# Patient Record
Sex: Male | Born: 1980 | Race: White | Hispanic: No | Marital: Married | Smoking: Never smoker
Health system: Southern US, Community
[De-identification: ages and names within clinical notes are randomized; demographics above are authoritative.]

## PROBLEM LIST (undated history)

## (undated) DIAGNOSIS — M71161 Other infective bursitis, right knee: Secondary | ICD-10-CM

## (undated) DIAGNOSIS — Z8719 Personal history of other diseases of the digestive system: Secondary | ICD-10-CM

## (undated) HISTORY — PX: ESOPHAGEAL DILATION: SHX303

---

## 2018-08-01 ENCOUNTER — Emergency Department (HOSPITAL_COMMUNITY): Payer: BC Managed Care – PPO

## 2018-08-01 ENCOUNTER — Emergency Department (HOSPITAL_COMMUNITY)
Admission: EM | Admit: 2018-08-01 | Discharge: 2018-08-01 | Disposition: A | Discharge status: Home / Self Care | Payer: BC Managed Care – PPO | Attending: Emergency Medicine | Admitting: Emergency Medicine

## 2018-08-01 ENCOUNTER — Other Ambulatory Visit: Payer: Self-pay

## 2018-08-01 ENCOUNTER — Encounter (HOSPITAL_COMMUNITY): Payer: Self-pay | Admitting: Emergency Medicine

## 2018-08-01 DIAGNOSIS — L03115 Cellulitis of right lower limb: Secondary | ICD-10-CM | POA: Diagnosis not present

## 2018-08-01 DIAGNOSIS — L0390 Cellulitis, unspecified: Secondary | ICD-10-CM

## 2018-08-01 DIAGNOSIS — M25561 Pain in right knee: Secondary | ICD-10-CM | POA: Diagnosis present

## 2018-08-01 LAB — CBC WITH DIFFERENTIAL/PLATELET
Abs Immature Granulocytes: 0.04 10*3/uL (ref 0.00–0.07)
Basophils Absolute: 0 10*3/uL (ref 0.0–0.1)
Basophils Relative: 0 %
Eosinophils Absolute: 0.1 10*3/uL (ref 0.0–0.5)
Eosinophils Relative: 1 %
HCT: 46 % (ref 39.0–52.0)
Hemoglobin: 15.6 g/dL (ref 13.0–17.0)
Immature Granulocytes: 0 %
Lymphocytes Relative: 15 %
Lymphs Abs: 2.3 10*3/uL (ref 0.7–4.0)
MCH: 29.2 pg (ref 26.0–34.0)
MCHC: 33.9 g/dL (ref 30.0–36.0)
MCV: 86.1 fL (ref 80.0–100.0)
Monocytes Absolute: 1 10*3/uL (ref 0.1–1.0)
Monocytes Relative: 7 %
Neutro Abs: 11.9 10*3/uL — ABNORMAL HIGH (ref 1.7–7.7)
Neutrophils Relative %: 77 %
Platelets: 257 10*3/uL (ref 150–400)
RBC: 5.34 MIL/uL (ref 4.22–5.81)
RDW: 13.3 % (ref 11.5–15.5)
WBC: 15.4 10*3/uL — ABNORMAL HIGH (ref 4.0–10.5)
nRBC: 0 % (ref 0.0–0.2)

## 2018-08-01 LAB — COMPREHENSIVE METABOLIC PANEL
ALT: 35 U/L (ref 0–44)
AST: 20 U/L (ref 15–41)
Albumin: 4.8 g/dL (ref 3.5–5.0)
Alkaline Phosphatase: 73 U/L (ref 38–126)
Anion gap: 11 (ref 5–15)
BUN: 21 mg/dL — ABNORMAL HIGH (ref 6–20)
CO2: 27 mmol/L (ref 22–32)
Calcium: 9.3 mg/dL (ref 8.9–10.3)
Chloride: 99 mmol/L (ref 98–111)
Creatinine, Ser: 0.97 mg/dL (ref 0.61–1.24)
GFR calc Af Amer: >60 mL/min (ref >60)
GFR calc non Af Amer: >60 mL/min (ref >60)
Glucose, Bld: 97 mg/dL (ref 70–99)
Potassium: 4.1 mmol/L (ref 3.5–5.1)
Sodium: 137 mmol/L (ref 135–145)
Total Bilirubin: 0.8 mg/dL (ref 0.3–1.2)
Total Protein: 8.3 g/dL — ABNORMAL HIGH (ref 6.5–8.1)

## 2018-08-01 LAB — LACTIC ACID, PLASMA
Lactic Acid, Venous: 1.8 mmol/L (ref 0.5–1.9)
Lactic Acid, Venous: 1.7 mmol/L (ref 0.5–1.9)

## 2018-08-01 MED ORDER — SODIUM CHLORIDE 0.9 % IV SOLN
1.0000 g | Freq: Once | INTRAVENOUS | Status: AC
  Administered 2018-08-01: 15:00:00 1 g via INTRAVENOUS
  Filled 2018-08-01: qty 10

## 2018-08-01 MED ORDER — FENTANYL CITRATE (PF) 100 MCG/2ML IJ SOLN
50.0000 ug | INTRAMUSCULAR | Status: AC | PRN
  Administered 2018-08-01 (×2): 50 ug via INTRAVENOUS
  Filled 2018-08-01 (×2): qty 2

## 2018-08-01 MED ORDER — CEPHALEXIN 500 MG PO CAPS: 500.0000 mg | ORAL_CAPSULE | Freq: Two times a day (BID) | ORAL | 0 refills | Status: AC

## 2018-08-01 NOTE — ED Triage Notes (Signed)
Pt sent by UC for infection in his right knee. They sent him to be checked for infection in the joint.

## 2018-08-01 NOTE — ED Provider Notes (Signed)
[facility] EMERGENCY DEPARTMENT Provider Note   CSN: [id number] Arrival date & time: 08/01/18  1125    History   Chief Complaint Chief Complaint  Patient presents with   Cellulitis    HPI Brandon HeapDavid Graham is a [age] y.o. male.     HPI   Patient is a 38 year old male who presents to the emergency department today for evaluation of right knee pain.  States that a few days ago he thought that he had an ingrown hair on his knees so he plucked the hair out.  The next morning he woke up and he had redness and pain to the patella.  The redness and pain is gradually gotten worse.  The pain is constant.  He rates at 9/10.  He also now has pain that tracks up the thigh and down the calf.  He has had no fevers.  No nausea vomiting or other systemic symptoms at home.  He denies a history of IV drug use, HIV, diabetes, or other history of immunocompromise  He was seen at urgent care prior to arrival and was sent here for further evaluation.  History reviewed. No pertinent past medical history.  There are no active problems to display for this patient.   History reviewed. No pertinent surgical history.    Home Medications    Prior to Admission medications   Medication Sig Start Date End Date Taking? Authorizing Provider  cephALEXin (KEFLEX) 500 MG capsule Take 1 capsule (500 mg total) by mouth 2 (two) times daily for 7 days. 08/01/18 08/08/18  Bettey Muraoka S, PA-C    Family History No family history on file.  Social History Social History   Tobacco Use   Smoking status: Never Smoker   Smokeless tobacco: Never Used  Substance Use Topics   Alcohol use: Never    Frequency: Never   Drug use: Never     Allergies   Patient has no allergy information on record.   Review of Systems Review of Systems  Constitutional: Negative for chills and fever.  HENT: Negative for congestion.   Eyes: Negative for visual disturbance.  Respiratory: Negative for shortness of breath.     Cardiovascular: Negative for chest pain.  Gastrointestinal: Negative for nausea and vomiting.  Genitourinary: Negative for flank pain.  Musculoskeletal:       Right knee pain  Skin: Positive for color change.  Neurological: Negative for weakness and numbness.     Physical Exam Updated Vital Signs BP 119/69    Pulse 91    Temp 98.8 F (37.1 C) (Oral)    Resp 18    Ht 5\' 8"  (1.727 m)    Wt 99.8 kg    SpO2 100%    BMI 33.45 kg/m   Physical Exam Vitals signs and nursing note reviewed.  Constitutional:      Appearance: He is well-developed.  HENT:     Head: Normocephalic and atraumatic.  Eyes:     Conjunctiva/sclera: Conjunctivae normal.  Neck:     Musculoskeletal: Neck supple.  Cardiovascular:     Rate and Rhythm: Regular rhythm. Tachycardia present.     Pulses: Normal pulses.  Pulmonary:     Effort: Pulmonary effort is normal.     Breath sounds: Normal breath sounds.  Abdominal:     Palpations: Abdomen is soft.     Tenderness: There is no abdominal tenderness.  Musculoskeletal:     Comments: Erythema noted to the patella with central punctate area that is open. No drainage  of pus. There is induration but no fluctuance. No joint effusion noted. No TTP to the non-erythematous areas of the knee (suprapatellar, infrapatellar, medial/lateral joint lines). Able to fully flex/extend the knee without significant discomfort. Able to ambulate. No edema to the RLE, there is some calf TTP.   Skin:    General: Skin is warm and dry.  Neurological:     Mental Status: He is alert.      ED Treatments / Results  Labs (all labs ordered are listed, but only abnormal results are displayed) Labs Reviewed  COMPREHENSIVE METABOLIC PANEL - Abnormal; Notable for the following components:      Result Value   BUN 21 (*)    Total Protein 8.3 (*)    All other components within normal limits  CBC WITH DIFFERENTIAL/PLATELET - Abnormal; Notable for the following components:   WBC 15.4 (*)     Neutro Abs 11.9 (*)    All other components within normal limits  LACTIC ACID, PLASMA  LACTIC ACID, PLASMA    EKG None  Radiology [place] Venous Img Lower Right (dvt Study)  Result Date: 08/01/2018 CLINICAL DATA:  38 year old male with right knee redness and pain EXAM: RIGHT LOWER EXTREMITY VENOUS DOPPLER ULTRASOUND TECHNIQUE: Gray-scale sonography with graded compression, as well as color Doppler and duplex ultrasound were performed to evaluate the lower extremity deep venous systems from the level of the common femoral vein and including the common femoral, femoral, profunda femoral, popliteal and calf veins including the posterior tibial, peroneal and gastrocnemius veins when visible. The superficial great saphenous vein was also interrogated. Spectral Doppler was utilized to evaluate flow at rest and with distal augmentation maneuvers in the common femoral, femoral and popliteal veins. COMPARISON:  None. FINDINGS: Contralateral Common Femoral Vein: Respiratory phasicity is normal and symmetric with the symptomatic side. No evidence of thrombus. Normal compressibility. Common Femoral Vein: No evidence of thrombus. Normal compressibility, respiratory phasicity and response to augmentation. Saphenofemoral Junction: No evidence of thrombus. Normal compressibility and flow on color Doppler imaging. Profunda Femoral Vein: No evidence of thrombus. Normal compressibility and flow on color Doppler imaging. Femoral Vein: No evidence of thrombus. Normal compressibility, respiratory phasicity and response to augmentation. Popliteal Vein: No evidence of thrombus. Normal compressibility, respiratory phasicity and response to augmentation. Calf Veins: No evidence of thrombus. Normal compressibility and flow on color Doppler imaging. Superficial Great Saphenous Vein: No evidence of thrombus. Normal compressibility. Venous Reflux:  None. Other Findings: Mildly prominent superficial inguinal lymph node is almost certainly  reactive in nature. IMPRESSION: 1. No evidence of deep venous thrombosis. 2. A mildly prominent superficial inguinal lymph node is likely reactive in nature. Electronically Signed   By: Malachy MoanHeath  McCullough M.D.   On: 08/01/2018 14:38   Dg Knee Complete 4 Views Right  Result Date: 08/01/2018 CLINICAL DATA:  Anterior knee pain, swelling, and redness since Friday. EXAM: RIGHT KNEE - COMPLETE 4+ VIEW COMPARISON:  None. FINDINGS: No acute fracture or dislocation. No joint effusion. Minimal medial compartment joint space narrowing with small marginal osteophytes. Bone mineralization is normal. Prepatellar soft tissue swelling. IMPRESSION: 1. Prepatellar soft tissue swelling.  No acute osseous abnormality. 2. Mild medial compartment degenerative changes. Electronically Signed   By: Obie DredgeWilliam T Derry M.D.   On: 08/01/2018 12:01    Procedures Procedures (including critical care time)  Medications Ordered in ED Medications  fentaNYL (SUBLIMAZE) injection 50 mcg (50 mcg Intravenous Given 08/01/18 1519)  cefTRIAXone (ROCEPHIN) 1 g in sodium chloride 0.9 % 100 mL IVPB (  0 g Intravenous Stopped 08/01/18 1515)     Initial Impression / Assessment and Plan / ED Course  I have reviewed the triage vital signs and the nursing notes.  Pertinent labs & imaging results that were available during my care of the patient were reviewed by me and considered in my medical decision making (see chart for details).    Final Clinical Impressions(s) / ED Diagnoses   Final diagnoses:  Cellulitis, unspecified cellulitis site   Pt here with c/o redness and pain to the right knee for several days after he pulled out an ingrown hair over the patella. No h/o IVDU. No h/o diabetes, hiv or immunocompromise. No systemic sxs or fevers.  On arrival he is afebrile. He is marginally tachycardic and hypertensive which I suspect are 2/2 pain as they both improved following administration of pain medication.  Erythema noted to the patella  with central punctate area that is open. No drainage of pus. There is induration but no fluctuance. No joint effusion noted. No TTP to the non-erythematous areas of the knee (suprapatellar, infrapatellar, medial/lateral joint lines). Able to fully flex/extend the knee without significant discomfort. Able to ambulate. No edema to the RLE, there is some calf TTP.    Labs drawn in triage reviewed. CBC notable for leukocytosis.  CMP is nonacute Lactic acid is negative  Xray with Prepatellar soft tissue swelling.  No acute osseous abnormality. 2. Mild medial compartment degenerative changes. Notably, there is no joint effusion.   Clinically, pt appears to have cellulitis over the patella. He is nontoxic and nonseptic appearing. He does not meet admission criteria. Have very low suspicion for a septic joint given he is able to fully range his knee without difficulty and has no evidence of joint effusion on xray. Dose of abx given in the ED.   2:26 PM Discussed case with orthopedics Dr. Edmonia Lynch with orthopedics who recommends placing the pt on keflex and having him f/u in the office on Wed or Fri (7/15 or 7/17).   Pt seen in conjunction with Dr. Thurnell Garbe who personally evaluated the pt and is in agreement with this diagnosis. She did recommend LE [place] due to pt c/o calf pain which was negative for DVT.   Discussed plan with pt. Discussed specific return precautions. he voices understanding and is in agreement.  All questions answered, patient stable for discharge.  ED Discharge Orders         Ordered    cephALEXin (KEFLEX) 500 MG capsule  2 times daily     08/01/18 1537           Nickie Warwick S, PA-C 08/01/18 [place], [place], DO 08/05/18 1535

## 2018-08-01 NOTE — Discharge Instructions (Signed)
You were given a prescription for antibiotics. Please take the antibiotic prescription fully.   You were given a referral to orthopedics, please call the office today to schedule an appointment for later this week.  Dr. Percell Miller stated that he could likely see him in the office on either Wednesday or Friday of this week.  Please return to the emergency room immediately if you experience any new or worsening symptoms or any symptoms that indicate worsening infection such as fevers, increased redness/swelling/pain, warmth, or drainage from the affected area.

## 2018-08-03 ENCOUNTER — Other Ambulatory Visit: Payer: Self-pay

## 2018-08-03 ENCOUNTER — Encounter (HOSPITAL_BASED_OUTPATIENT_CLINIC_OR_DEPARTMENT_OTHER): Payer: Self-pay | Admitting: *Deleted

## 2018-08-04 ENCOUNTER — Other Ambulatory Visit (HOSPITAL_COMMUNITY)
Admission: RE | Admit: 2018-08-04 | Discharge: 2018-08-04 | Disposition: A | Discharge status: Home / Self Care | Payer: BC Managed Care – PPO | Source: Ambulatory Visit | Attending: Orthopedic Surgery | Admitting: Orthopedic Surgery

## 2018-08-04 DIAGNOSIS — Z1159 Encounter for screening for other viral diseases: Secondary | ICD-10-CM | POA: Insufficient documentation

## 2018-08-04 LAB — SARS CORONAVIRUS 2 (TAT 6-24 HRS): SARS Coronavirus 2: NEGATIVE

## 2018-08-05 ENCOUNTER — Ambulatory Visit (HOSPITAL_BASED_OUTPATIENT_CLINIC_OR_DEPARTMENT_OTHER): Payer: BC Managed Care – PPO | Admitting: Anesthesiology

## 2018-08-05 ENCOUNTER — Ambulatory Visit (HOSPITAL_BASED_OUTPATIENT_CLINIC_OR_DEPARTMENT_OTHER)
Admission: RE | Admit: 2018-08-05 | Discharge: 2018-08-05 | Disposition: A | Discharge status: Home / Self Care | Payer: BC Managed Care – PPO | Attending: Orthopedic Surgery | Admitting: Orthopedic Surgery

## 2018-08-05 ENCOUNTER — Encounter (HOSPITAL_BASED_OUTPATIENT_CLINIC_OR_DEPARTMENT_OTHER): Payer: Self-pay | Admitting: Emergency Medicine

## 2018-08-05 ENCOUNTER — Other Ambulatory Visit: Payer: Self-pay

## 2018-08-05 ENCOUNTER — Encounter (HOSPITAL_BASED_OUTPATIENT_CLINIC_OR_DEPARTMENT_OTHER)
Admission: RE | Disposition: A | Discharge status: Home / Self Care | Payer: Self-pay | Source: Home / Self Care | Attending: Orthopedic Surgery

## 2018-08-05 DIAGNOSIS — B9561 Methicillin susceptible Staphylococcus aureus infection as the cause of diseases classified elsewhere: Secondary | ICD-10-CM | POA: Insufficient documentation

## 2018-08-05 DIAGNOSIS — M7041 Prepatellar bursitis, right knee: Secondary | ICD-10-CM | POA: Diagnosis present

## 2018-08-05 DIAGNOSIS — M71161 Other infective bursitis, right knee: Secondary | ICD-10-CM

## 2018-08-05 HISTORY — DX: Other infective bursitis, right knee: M71.161

## 2018-08-05 HISTORY — PX: KNEE BURSECTOMY: SHX5882

## 2018-08-05 HISTORY — PX: INCISION AND DRAINAGE: SHX5863

## 2018-08-05 HISTORY — DX: Personal history of other diseases of the digestive system: Z87.19

## 2018-08-05 SURGERY — BURSECTOMY, KNEE
Anesthesia: General | Site: Knee | Laterality: Right

## 2018-08-05 MED ORDER — LIDOCAINE 2% (20 MG/ML) 5 ML SYRINGE
INTRAMUSCULAR | Status: AC
  Filled 2018-08-05: qty 5

## 2018-08-05 MED ORDER — CEFAZOLIN SODIUM-DEXTROSE 2-4 GM/100ML-% IV SOLN
2.0000 g | INTRAVENOUS | Status: AC
  Administered 2018-08-05: 2 g via INTRAVENOUS

## 2018-08-05 MED ORDER — BUPIVACAINE HCL (PF) 0.25 % IJ SOLN
INTRAMUSCULAR | Status: AC
  Filled 2018-08-05: qty 30

## 2018-08-05 MED ORDER — GABAPENTIN 300 MG PO CAPS
ORAL_CAPSULE | ORAL | Status: AC
  Filled 2018-08-05: qty 1

## 2018-08-05 MED ORDER — ONDANSETRON HCL 4 MG PO TABS: 4.0000 mg | ORAL_TABLET | Freq: Three times a day (TID) | ORAL | 0 refills | Status: AC | PRN

## 2018-08-05 MED ORDER — CELECOXIB 200 MG PO CAPS
ORAL_CAPSULE | ORAL | Status: AC
  Filled 2018-08-05: qty 2

## 2018-08-05 MED ORDER — ROCURONIUM BROMIDE 10 MG/ML (PF) SYRINGE
PREFILLED_SYRINGE | INTRAVENOUS | Status: AC
  Filled 2018-08-05: qty 10

## 2018-08-05 MED ORDER — DEXMEDETOMIDINE HCL IN NACL 200 MCG/50ML IV SOLN
INTRAVENOUS | Status: DC | PRN
  Administered 2018-08-05: 25 ug via INTRAVENOUS

## 2018-08-05 MED ORDER — ASPIRIN EC 81 MG PO TBEC: 81.0000 mg | DELAYED_RELEASE_TABLET | Freq: Every day | ORAL | 0 refills | Status: AC

## 2018-08-05 MED ORDER — OXYCODONE HCL 5 MG PO TABS
5.0000 mg | ORAL_TABLET | Freq: Once | ORAL | Status: AC | PRN
  Administered 2018-08-05: 5 mg via ORAL

## 2018-08-05 MED ORDER — ACETAMINOPHEN 500 MG PO TABS
1000.0000 mg | ORAL_TABLET | Freq: Once | ORAL | Status: AC
  Administered 2018-08-05: 1000 mg via ORAL

## 2018-08-05 MED ORDER — OXYCODONE HCL 5 MG/5ML PO SOLN: 5.0000 mg | Freq: Once | ORAL | Status: AC | PRN

## 2018-08-05 MED ORDER — PHENYLEPHRINE 40 MCG/ML (10ML) SYRINGE FOR IV PUSH (FOR BLOOD PRESSURE SUPPORT)
PREFILLED_SYRINGE | INTRAVENOUS | Status: AC
  Filled 2018-08-05: qty 10

## 2018-08-05 MED ORDER — ACETAMINOPHEN 500 MG PO TABS: 1000.0000 mg | ORAL_TABLET | Freq: Once | ORAL | Status: DC | PRN

## 2018-08-05 MED ORDER — SUCCINYLCHOLINE CHLORIDE 200 MG/10ML IV SOSY
PREFILLED_SYRINGE | INTRAVENOUS | Status: AC
  Filled 2018-08-05: qty 10

## 2018-08-05 MED ORDER — BUPIVACAINE HCL (PF) 0.5 % IJ SOLN
INTRAMUSCULAR | Status: AC
  Filled 2018-08-05: qty 30

## 2018-08-05 MED ORDER — OXYCODONE HCL 5 MG PO TABS
ORAL_TABLET | ORAL | Status: AC
  Filled 2018-08-05: qty 1

## 2018-08-05 MED ORDER — FENTANYL CITRATE (PF) 100 MCG/2ML IJ SOLN
INTRAMUSCULAR | Status: AC
  Filled 2018-08-05: qty 2

## 2018-08-05 MED ORDER — HYDROCODONE-ACETAMINOPHEN 5-325 MG PO TABS: 1.0000 | ORAL_TABLET | Freq: Four times a day (QID) | ORAL | 0 refills | Status: AC | PRN

## 2018-08-05 MED ORDER — LIDOCAINE 2% (20 MG/ML) 5 ML SYRINGE
INTRAMUSCULAR | Status: DC | PRN
  Administered 2018-08-05: 80 mg via INTRAVENOUS

## 2018-08-05 MED ORDER — CHLORHEXIDINE GLUCONATE 4 % EX LIQD: 60.0000 mL | Freq: Once | CUTANEOUS | Status: DC

## 2018-08-05 MED ORDER — BUPIVACAINE HCL (PF) 0.5 % IJ SOLN
INTRAMUSCULAR | Status: DC | PRN
  Administered 2018-08-05: 20 mL

## 2018-08-05 MED ORDER — ONDANSETRON HCL 4 MG/2ML IJ SOLN
INTRAMUSCULAR | Status: AC
  Filled 2018-08-05: qty 2

## 2018-08-05 MED ORDER — ENSURE PRE-SURGERY PO LIQD: 296.0000 mL | Freq: Once | ORAL | Status: DC

## 2018-08-05 MED ORDER — ACETAMINOPHEN 500 MG PO TABS
ORAL_TABLET | ORAL | Status: AC
  Filled 2018-08-05: qty 2

## 2018-08-05 MED ORDER — FENTANYL CITRATE (PF) 100 MCG/2ML IJ SOLN: 25.0000 ug | INTRAMUSCULAR | Status: DC | PRN

## 2018-08-05 MED ORDER — DEXAMETHASONE SODIUM PHOSPHATE 10 MG/ML IJ SOLN
INTRAMUSCULAR | Status: AC
  Filled 2018-08-05: qty 1

## 2018-08-05 MED ORDER — EPHEDRINE 5 MG/ML INJ
INTRAVENOUS | Status: AC
  Filled 2018-08-05: qty 10

## 2018-08-05 MED ORDER — ACETAMINOPHEN 10 MG/ML IV SOLN: 1000.0000 mg | Freq: Once | INTRAVENOUS | Status: DC | PRN

## 2018-08-05 MED ORDER — HYDROCODONE-ACETAMINOPHEN 5-325 MG PO TABS: 1.0000 | ORAL_TABLET | Freq: Four times a day (QID) | ORAL | 0 refills | Status: DC | PRN

## 2018-08-05 MED ORDER — CELECOXIB 200 MG PO CAPS: 200.0000 mg | ORAL_CAPSULE | Freq: Two times a day (BID) | ORAL | 0 refills | Status: AC

## 2018-08-05 MED ORDER — LACTATED RINGERS IV SOLN: INTRAVENOUS | Status: DC

## 2018-08-05 MED ORDER — FENTANYL CITRATE (PF) 100 MCG/2ML IJ SOLN
50.0000 ug | INTRAMUSCULAR | Status: DC | PRN
  Administered 2018-08-05: 100 ug via INTRAVENOUS

## 2018-08-05 MED ORDER — CEFAZOLIN SODIUM-DEXTROSE 2-4 GM/100ML-% IV SOLN
INTRAVENOUS | Status: AC
  Filled 2018-08-05: qty 100

## 2018-08-05 MED ORDER — MIDAZOLAM HCL 2 MG/2ML IJ SOLN
1.0000 mg | INTRAMUSCULAR | Status: DC | PRN
  Administered 2018-08-05: 2 mg via INTRAVENOUS

## 2018-08-05 MED ORDER — LACTATED RINGERS IV SOLN
INTRAVENOUS | Status: DC
  Administered 2018-08-05 (×2): via INTRAVENOUS

## 2018-08-05 MED ORDER — ACETAMINOPHEN 160 MG/5ML PO SOLN: 1000.0000 mg | Freq: Once | ORAL | Status: DC | PRN

## 2018-08-05 MED ORDER — MIDAZOLAM HCL 2 MG/2ML IJ SOLN
INTRAMUSCULAR | Status: AC
  Filled 2018-08-05: qty 2

## 2018-08-05 MED ORDER — DEXAMETHASONE SODIUM PHOSPHATE 4 MG/ML IJ SOLN
INTRAMUSCULAR | Status: DC | PRN
  Administered 2018-08-05: 10 mg via INTRAVENOUS

## 2018-08-05 MED ORDER — PROPOFOL 10 MG/ML IV BOLUS
INTRAVENOUS | Status: DC | PRN
  Administered 2018-08-05: 200 mg via INTRAVENOUS

## 2018-08-05 MED ORDER — SCOPOLAMINE 1 MG/3DAYS TD PT72: 1.0000 | MEDICATED_PATCH | Freq: Once | TRANSDERMAL | Status: DC

## 2018-08-05 MED ORDER — GABAPENTIN 300 MG PO CAPS
300.0000 mg | ORAL_CAPSULE | Freq: Once | ORAL | Status: AC
  Administered 2018-08-05: 300 mg via ORAL

## 2018-08-05 MED ORDER — CELECOXIB 400 MG PO CAPS
400.0000 mg | ORAL_CAPSULE | Freq: Once | ORAL | Status: AC
  Administered 2018-08-05: 400 mg via ORAL

## 2018-08-05 SURGICAL SUPPLY — 66 items
BAG DECANTER FOR FLEXI CONT (MISCELLANEOUS) IMPLANT
BANDAGE ACE 6X5 VEL STRL LF (GAUZE/BANDAGES/DRESSINGS) ×3 IMPLANT
BANDAGE ESMARK 6X9 LF (GAUZE/BANDAGES/DRESSINGS) ×1 IMPLANT
BLADE SURG 10 STRL SS (BLADE) ×3 IMPLANT
BLADE SURG 15 STRL LF DISP TIS (BLADE) ×1 IMPLANT
BLADE SURG 15 STRL SS (BLADE) ×2
BNDG ESMARK 6X9 LF (GAUZE/BANDAGES/DRESSINGS) ×3
CHLORAPREP W/TINT 26 (MISCELLANEOUS) ×3 IMPLANT
CLEANER CAUTERY TIP 5X5 PAD (MISCELLANEOUS) IMPLANT
CLOSURE STERI-STRIP 1/2X4 (GAUZE/BANDAGES/DRESSINGS)
CLSR STERI-STRIP ANTIMIC 1/2X4 (GAUZE/BANDAGES/DRESSINGS) ×1 IMPLANT
COVER WAND RF STERILE (DRAPES) IMPLANT
CUFF TOURN SGL QUICK 34 (TOURNIQUET CUFF) ×2
CUFF TRNQT CYL 34X4.125X (TOURNIQUET CUFF) ×1 IMPLANT
DECANTER SPIKE VIAL GLASS SM (MISCELLANEOUS) IMPLANT
DRAIN PENROSE 1/4X12 LTX STRL (WOUND CARE) ×2 IMPLANT
DRAPE EXTREMITY T 121X128X90 (DISPOSABLE) ×3 IMPLANT
DRAPE U-SHAPE 47X51 STRL (DRAPES) ×3 IMPLANT
DRSG ADAPTIC 3X8 NADH LF (GAUZE/BANDAGES/DRESSINGS) ×3 IMPLANT
DRSG EMULSION OIL 3X3 NADH (GAUZE/BANDAGES/DRESSINGS) ×2 IMPLANT
DRSG PAD ABDOMINAL 8X10 ST (GAUZE/BANDAGES/DRESSINGS) ×3 IMPLANT
ELECT REM PT RETURN 9FT ADLT (ELECTROSURGICAL) ×3
ELECTRODE REM PT RTRN 9FT ADLT (ELECTROSURGICAL) ×1 IMPLANT
GAUZE PACKING IODOFORM 1/4X5 (PACKING) IMPLANT
GAUZE SPONGE 4X4 12PLY STRL (GAUZE/BANDAGES/DRESSINGS) ×3 IMPLANT
GLOVE BIO SURGEON STRL SZ7.5 (GLOVE) ×6 IMPLANT
GLOVE BIOGEL PI IND STRL 8 (GLOVE) ×2 IMPLANT
GLOVE BIOGEL PI INDICATOR 8 (GLOVE) ×4
GOWN STRL REUS W/ TWL LRG LVL3 (GOWN DISPOSABLE) ×4 IMPLANT
GOWN STRL REUS W/ TWL XL LVL3 (GOWN DISPOSABLE) ×1 IMPLANT
GOWN STRL REUS W/TWL LRG LVL3 (GOWN DISPOSABLE) ×8
GOWN STRL REUS W/TWL XL LVL3 (GOWN DISPOSABLE) ×2
IMMOBILIZER KNEE 22 UNIV (SOFTGOODS) IMPLANT
IMMOBILIZER KNEE 24 THIGH 36 (MISCELLANEOUS) IMPLANT
IMMOBILIZER KNEE 24 UNIV (MISCELLANEOUS)
NDL MAYO TROCAR (NEEDLE) IMPLANT
NDL SUT 6 .5 CRC .975X.05 MAYO (NEEDLE) IMPLANT
NEEDLE MAYO TAPER (NEEDLE)
NEEDLE MAYO TROCAR (NEEDLE) IMPLANT
NS IRRIG 1000ML POUR BTL (IV SOLUTION) ×1 IMPLANT
PACK ARTHROSCOPY DSU (CUSTOM PROCEDURE TRAY) ×3 IMPLANT
PACK BASIN DAY SURGERY FS (CUSTOM PROCEDURE TRAY) ×3 IMPLANT
PAD CLEANER CAUTERY TIP 5X5 (MISCELLANEOUS)
PADDING CAST COTTON 6X4 STRL (CAST SUPPLIES) ×1 IMPLANT
PENCIL BUTTON HOLSTER BLD 10FT (ELECTRODE) ×3 IMPLANT
SLEEVE SCD COMPRESS KNEE MED (MISCELLANEOUS) IMPLANT
SPONGE LAP 4X18 RFD (DISPOSABLE) ×5 IMPLANT
STAPLER VISISTAT 35W (STAPLE) IMPLANT
SUT ETHILON 3 0 PS 1 (SUTURE) ×3 IMPLANT
SUT FIBERWIRE #2 38 T-5 BLUE (SUTURE)
SUT MNCRL AB 4-0 PS2 18 (SUTURE) ×3 IMPLANT
SUT MON AB 2-0 CT1 36 (SUTURE) ×3 IMPLANT
SUT VIC AB 0 CT1 18XCR BRD 8 (SUTURE) IMPLANT
SUT VIC AB 0 CT1 27 (SUTURE) ×2
SUT VIC AB 0 CT1 27XBRD ANBCTR (SUTURE) ×1 IMPLANT
SUT VIC AB 0 CT1 8-18 (SUTURE)
SUT VIC AB 0 SH 27 (SUTURE) IMPLANT
SUT VIC AB 1 CT1 27 (SUTURE)
SUT VIC AB 1 CT1 27XBRD ANBCTR (SUTURE) IMPLANT
SUT VIC AB 2-0 SH 27 (SUTURE)
SUT VIC AB 2-0 SH 27XBRD (SUTURE) ×2 IMPLANT
SUTURE FIBERWR #2 38 T-5 BLUE (SUTURE) IMPLANT
SYR BULB 3OZ (MISCELLANEOUS) ×3 IMPLANT
TOWEL GREEN STERILE FF (TOWEL DISPOSABLE) ×5 IMPLANT
UNDERPAD 30X30 (UNDERPADS AND DIAPERS) ×3 IMPLANT
YANKAUER SUCT BULB TIP NO VENT (SUCTIONS) ×2 IMPLANT

## 2018-08-05 NOTE — Op Note (Signed)
08/05/2018  1:55 PM  PATIENT:  Brandon Graham    PRE-OPERATIVE DIAGNOSIS:  RIGHT KNEE BURSA INFECTION  POST-OPERATIVE DIAGNOSIS:  Same  PROCEDURE:  PRE-PATELLA BURSA, INCISION AND DRAINAGE  SURGEON:  Renette Butters, MD  ASSISTANT: Roxan Hockey, PA-C, he was present and scrubbed throughout the case, critical for completion in a timely fashion, and for retraction, instrumentation, and closure.   ANESTHESIA:   gen  PREOPERATIVE INDICATIONS:  Brandon Graham is a  38 y.o. male with a diagnosis of RIGHT KNEE BURSA INFECTION who failed conservative measures and elected for surgical management.    The risks benefits and alternatives were discussed with the patient preoperatively including but not limited to the risks of infection, bleeding, nerve injury, cardiopulmonary complications, the need for revision surgery, among others, and the patient was willing to proceed.  OPERATIVE IMPLANTS: none  OPERATIVE FINDINGS: purulent fluid  BLOOD LOSS: min  COMPLICATIONS: none  TOURNIQUET TIME: 12min  OPERATIVE PROCEDURE:  Patient was identified in the preoperative holding area and site was marked by me He was transported to the operating theater and placed on the table in supine position taking care to pad all bony prominences. After a preincinduction time out anesthesia was induced. The right lower extremity was prepped and draped in normal sterile fashion and a pre-incision timeout was performed. He received ancef after culture taken for preoperative antibiotics.   I extended his bedside incision proximally and distally.  I probed with a hemostat creating full-thickness skin flaps and was able to express purulent fluid from pockets of purulence on both medial and lateral aspect.  Completed a bursectomy was completed and confirmed no additional purulent fluid could be found.  I then irrigated with 3 L of saline.  I then closed over a 10 field drain.  Sterile dressing was applied he was awoken  and taken the PACU in stable condition  POST OPERATIVE PLAN: Weightbearing as tolerated mobilize and aspirin for DVT prophylaxis

## 2018-08-05 NOTE — Transfer of Care (Signed)
Immediate Anesthesia Transfer of Care Note  Patient: Brandon Graham  Procedure(s) Performed: PRE-PATELLA BURSA (Right Knee) INCISION AND DRAINAGE (Right Knee)  Patient Location: PACU  Anesthesia Type:General  Level of Consciousness: awake and drowsy  Airway & Oxygen Therapy: Patient Spontanous Breathing and Patient connected to face mask oxygen  Post-op Assessment: Report given to RN and Post -op Vital signs reviewed and stable  Post vital signs: Reviewed and stable  Last Vitals:  Vitals Value Taken Time  BP 111/68 08/05/18 1334  Temp    Pulse 78 08/05/18 1335  Resp 16 08/05/18 1335  SpO2 100 % 08/05/18 1335  Vitals shown include unvalidated device data.  Last Pain:  Vitals:   08/05/18 1126  TempSrc: Oral  PainSc: 0-No pain         Complications: No apparent anesthesia complications

## 2018-08-05 NOTE — Discharge Instructions (Signed)
Follow- Up Appointment:  Please call for an appointment to be seen Monday morning 08/08/18 to remove surgical drain.  Diet: As you were doing prior to hospitalization   Shower:  May shower but keep the wounds dry, use an occlusive plastic wrap, NO SOAKING IN TUB.  If the bandage gets wet, change with a clean dry gauze.  If you have a splint on, leave the splint in place and keep the splint dry with a plastic bag.  Dressing:  Keep dressings on and dry until follow up.  Activity:  Increase activity slowly as tolerated, but follow the weight bearing instructions below.  The rules on driving is that you can not be taking narcotics while you drive, and you must feel in control of the vehicle.    Weight Bearing:   As tolerated.  Elevate leg to reduce pain / swelling.  To prevent constipation: you may use a stool softener such as -  Colace (over the counter) 100 mg by mouth twice a day  Drink plenty of fluids (prune juice may be helpful) and high fiber foods Miralax (over the counter) for constipation as needed.    Itching:  If you experience itching with your medications, try taking only a single pain pill, or even half a pain pill at a time.  You can also use benadryl over the counter for itching or also to help with sleep.   Precautions:  If you experience chest pain or shortness of breath - call 911 immediately for transfer to the hospital emergency department!!  If you develop a fever greater that 101 F, purulent drainage from wound, increased redness or drainage from wound, or calf pain -- Call the office at [phone]                                                        No NSAIDS (Ibuprofen/Advil) or Tylenol before 5:45pm today.   Post Anesthesia Home Care Instructions  Activity: Get plenty of rest for the remainder of the day. A responsible individual must stay with you for 24 hours following the procedure.  For the next 24 hours, DO NOT: -Drive a car -Conservation officer, nature -Drink alcoholic beverages -Take any medication unless instructed by your physician -Make any legal decisions or sign important papers.  Meals: Start with liquid foods such as gelatin or soup. Progress to regular foods as tolerated. Avoid greasy, spicy, heavy foods. If nausea and/or vomiting occur, drink only clear liquids until the nausea and/or vomiting subsides. Call your physician if vomiting continues.  Special Instructions/Symptoms: Your throat may feel dry or sore from the anesthesia or the breathing tube placed in your throat during surgery. If this causes discomfort, gargle with warm salt water. The discomfort should disappear within 24 hours.  If you had a scopolamine patch placed behind your ear for the management of post- operative nausea and/or vomiting:  1. The medication in the patch is effective for 72 hours, after which it should be removed.  Wrap patch in a tissue and discard in the trash. Wash hands thoroughly with soap and water. 2. You may remove the patch earlier than 72 hours if you experience unpleasant side effects which may include dry mouth, dizziness or visual disturbances. 3. Avoid touching the patch. Wash your hands with soap and water after contact with  the patch.

## 2018-08-05 NOTE — Anesthesia Procedure Notes (Signed)
Procedure Name: LMA Insertion Date/Time: 08/05/2018 12:52 PM Performed by: Willa Frater, CRNA Pre-anesthesia Checklist: Patient identified, Emergency Drugs available, Suction available and Patient being monitored Patient Re-evaluated:Patient Re-evaluated prior to induction Oxygen Delivery Method: Circle system utilized Preoxygenation: Pre-oxygenation with 100% oxygen Induction Type: IV induction Ventilation: Mask ventilation without difficulty LMA: LMA inserted LMA Size: 5.0 Number of attempts: 1 Airway Equipment and Method: Bite block Placement Confirmation: positive ETCO2 Tube secured with: Tape Dental Injury: Teeth and Oropharynx as per pre-operative assessment

## 2018-08-05 NOTE — H&P (Signed)
ORTHOPAEDIC CONSULTATION  REQUESTING PHYSICIAN: Renette Butters, MD  Chief Complaint: Right prepatellar bursitis  HPI: Brandon Graham is a 38 y.o. male who complains of  1-2wks of prepatellar pain  Past Medical History:  Diagnosis Date  . History of esophageal stricture    had dilation  . Infected prepatellar bursa, right    Past Surgical History:  Procedure Laterality Date  . ESOPHAGEAL DILATION     Social History   Socioeconomic History  . Marital status: Married    Spouse name: Not on file  . Number of children: Not on file  . Years of education: Not on file  . Highest education level: Not on file  Occupational History  . Not on file  Social Needs  . Financial resource strain: Not on file  . Food insecurity    Worry: Not on file    Inability: Not on file  . Transportation needs    Medical: Not on file    Non-medical: Not on file  Tobacco Use  . Smoking status: Never Smoker  . Smokeless tobacco: Never Used  Substance and Sexual Activity  . Alcohol use: Never    Frequency: Never  . Drug use: Never  . Sexual activity: Never  Lifestyle  . Physical activity    Days per week: Not on file    Minutes per session: Not on file  . Stress: Not on file  Relationships  . Social Herbalist on phone: Not on file    Gets together: Not on file    Attends religious service: Not on file    Active member of club or organization: Not on file    Attends meetings of clubs or organizations: Not on file    Relationship status: Not on file  Other Topics Concern  . Not on file  Social History Narrative  . Not on file   History reviewed. No pertinent family history. No Known Allergies Prior to Admission medications   Medication Sig Start Date End Date Taking? Authorizing Provider  cephALEXin (KEFLEX) 500 MG capsule Take 1 capsule (500 mg total) by mouth 2 (two) times daily for 7 days. 08/01/18 08/08/18 Yes Couture, Cortni S, PA-C   No results found.   Positive ROS: All other systems have been reviewed and were otherwise negative with the exception of those mentioned in the HPI and as above.  Labs cbc No results for input(s): WBC, HGB, HCT, PLT in the last 72 hours.  Labs inflam No results for input(s): CRP in the last 72 hours.  Invalid input(s): ESR  Labs coag No results for input(s): INR, PTT in the last 72 hours.  Invalid input(s): PT  No results for input(s): NA, K, CL, CO2, GLUCOSE, BUN, CREATININE, CALCIUM in the last 72 hours.  Physical Exam: Vitals:   08/05/18 1126  BP: 126/69  Pulse: 73  Resp: 18  Temp: 99.2 F (37.3 C)  SpO2: 100%   General: Alert, no acute distress Cardiovascular: No pedal edema Respiratory: No cyanosis, no use of accessory musculature GI: No organomegaly, abdomen is soft and non-tender Skin: No lesions in the area of chief complaint other than those listed below in MSK exam.  Neurologic: Sensation intact distally save for the below mentioned MSK exam Psychiatric: Patient is competent for consent with normal mood and affect Lymphatic: No axillary or cervical lymphadenopathy  MUSCULOSKELETAL:  RLE: drainiage and erythema over patella, NVI Other extremities are atraumatic with painless ROM and NVI.  Assessment: R septic prepatellar bursa  Plan: OR today for prepatellar bursa and I&D of abscess.    Renette Butters, MD Cell [phone]   08/05/2018 12:33 PM

## 2018-08-05 NOTE — Anesthesia Preprocedure Evaluation (Signed)
Anesthesia Evaluation  Patient identified by MRN, date of birth, ID band Patient awake    Reviewed: Allergy & Precautions, NPO status , Patient's Chart, lab work & pertinent test results  Airway Mallampati: II  TM Distance: >3 FB Neck ROM: Full    Dental  (+) Dental Advisory Given   Pulmonary neg pulmonary ROS,    breath sounds clear to auscultation       Cardiovascular negative cardio ROS   Rhythm:Regular     Neuro/Psych negative neurological ROS  negative psych ROS   GI/Hepatic negative GI ROS, Neg liver ROS,   Endo/Other  negative endocrine ROS  Renal/GU negative Renal ROS     Musculoskeletal RIGHT KNEE BURSA INFECTION   Abdominal   Peds  Hematology negative hematology ROS (+)   Anesthesia Other Findings   Reproductive/Obstetrics                             Anesthesia Physical Anesthesia Plan  ASA: I  Anesthesia Plan: General   Post-op Pain Management:    Induction: Intravenous  PONV Risk Score and Plan: 2 and Ondansetron and Dexamethasone  Airway Management Planned: LMA  Additional Equipment: None  Intra-op Plan:   Post-operative Plan: Extubation in OR  Informed Consent: I have reviewed the patients History and Physical, chart, labs and discussed the procedure including the risks, benefits and alternatives for the proposed anesthesia with the patient or authorized representative who has indicated his/her understanding and acceptance.     Dental advisory given  Plan Discussed with: CRNA and Surgeon  Anesthesia Plan Comments:         Anesthesia Quick Evaluation

## 2018-08-06 NOTE — Anesthesia Postprocedure Evaluation (Signed)
Anesthesia Post Note  Patient: Brandon Graham  Procedure(s) Performed: PRE-PATELLA BURSA (Right Knee) INCISION AND DRAINAGE (Right Knee)     Patient location during evaluation: PACU Anesthesia Type: General Level of consciousness: awake and alert Pain management: pain level controlled Vital Signs Assessment: post-procedure vital signs reviewed and stable Respiratory status: spontaneous breathing, nonlabored ventilation, respiratory function stable and patient connected to nasal cannula oxygen Cardiovascular status: blood pressure returned to baseline and stable Postop Assessment: no apparent nausea or vomiting Anesthetic complications: no    Last Vitals:  Vitals:   08/05/18 1400 08/05/18 1445  BP: 119/73 121/67  Pulse: 75 72  Resp: 16 20  Temp:  36.6 C  SpO2: 100% 98%    Last Pain:  Vitals:   08/05/18 1430  TempSrc:   PainSc: 2                  Shanasia Ibrahim

## 2018-08-08 ENCOUNTER — Encounter (HOSPITAL_BASED_OUTPATIENT_CLINIC_OR_DEPARTMENT_OTHER): Payer: Self-pay | Admitting: Orthopedic Surgery

## 2018-08-10 LAB — AEROBIC/ANAEROBIC CULTURE W GRAM STAIN (SURGICAL/DEEP WOUND)

## 2019-12-26 IMAGING — US RIGHT LOWER EXTREMITY VENOUS ULTRASOUND
1 series · 13 of 24 positions shown · non-contrast
Comparison: None.

CLINICAL DATA: 37-year-old male with right knee redness and pain



[Series 1: right lower extremity venous ultrasound · 13 of 44 slices shown]
[im 1/44]
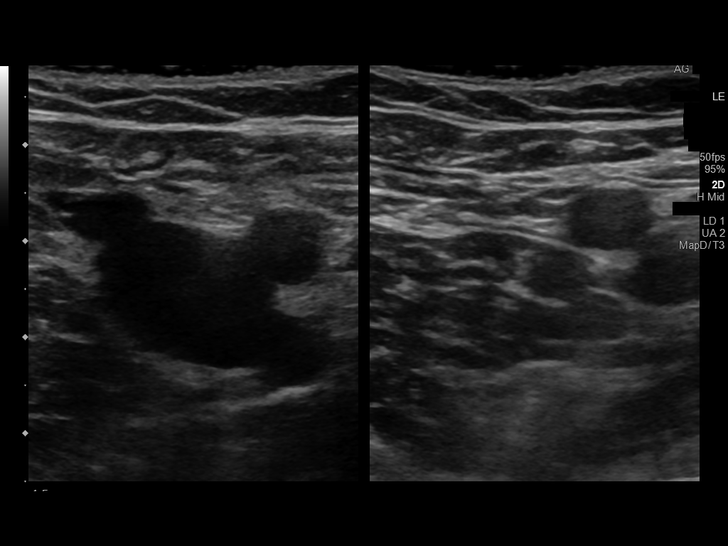
[im 4/44]
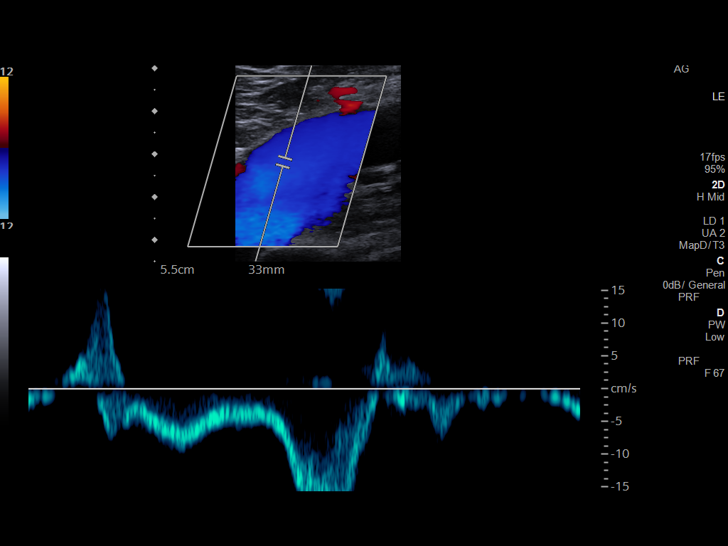
[im 8/44]
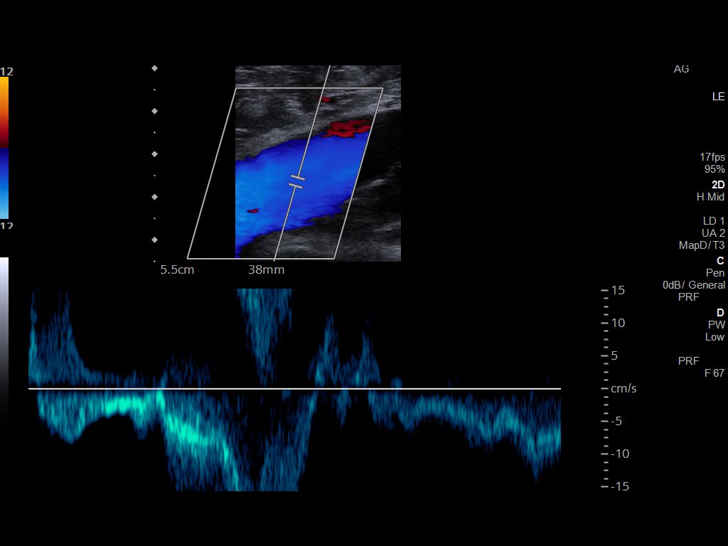
[im 12/44]
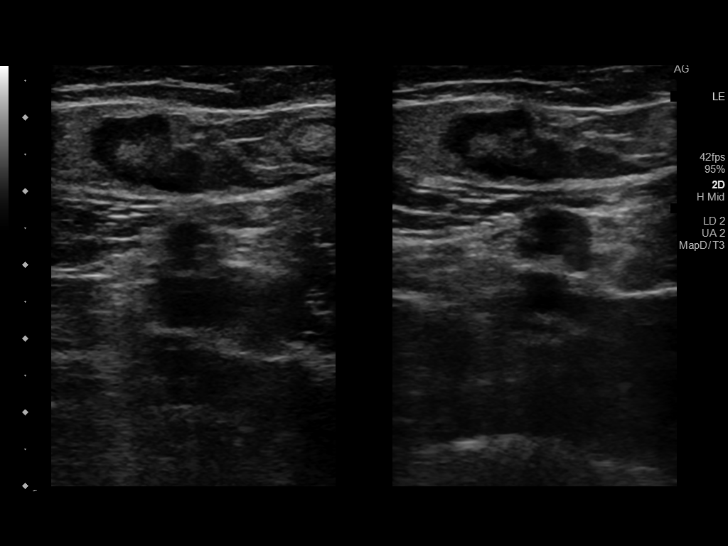
[im 15/44]
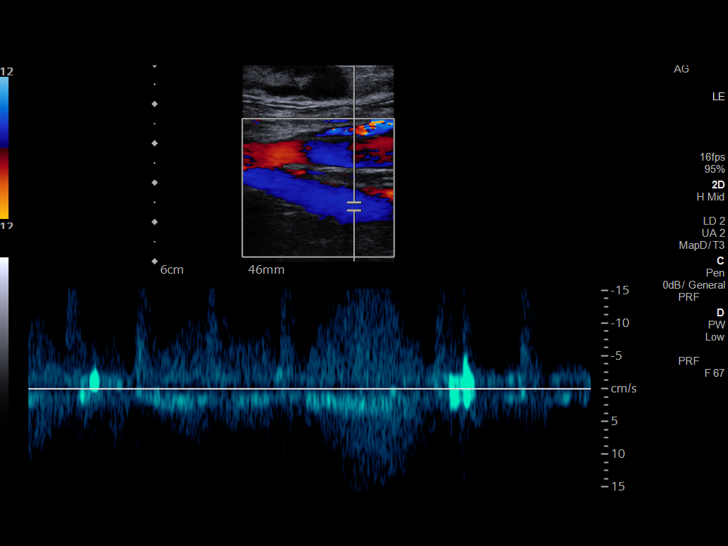
[im 19/44]
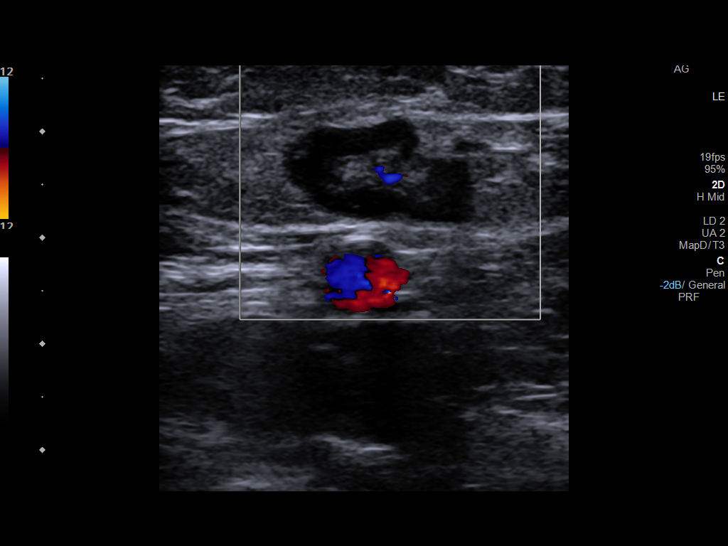
[im 23/44]
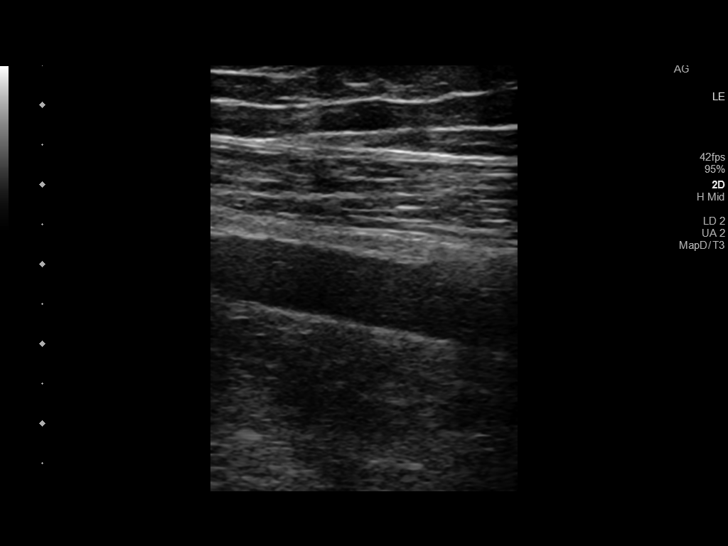
[im 25/44]
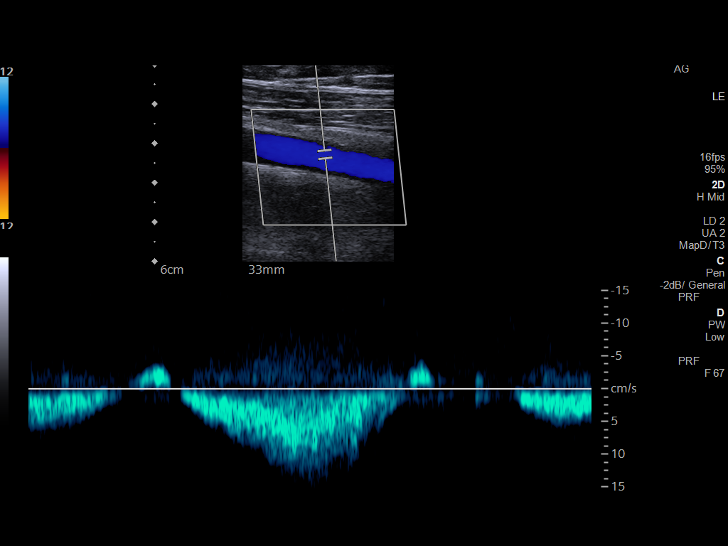
[im 29/44]
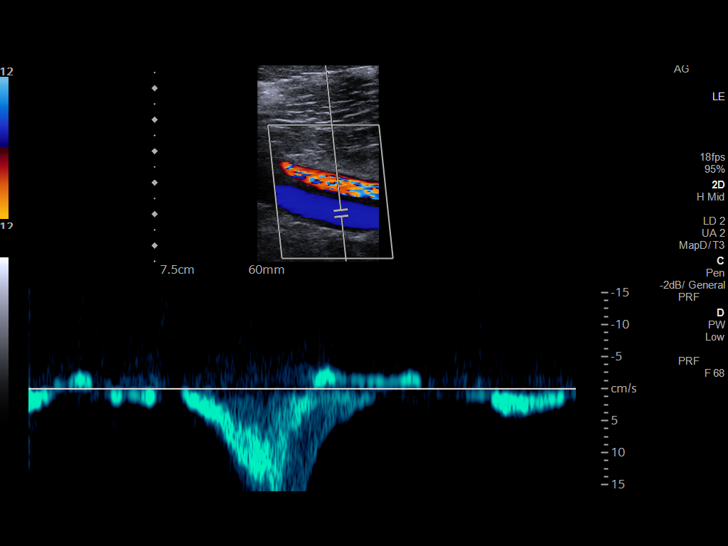
[im 32/44]
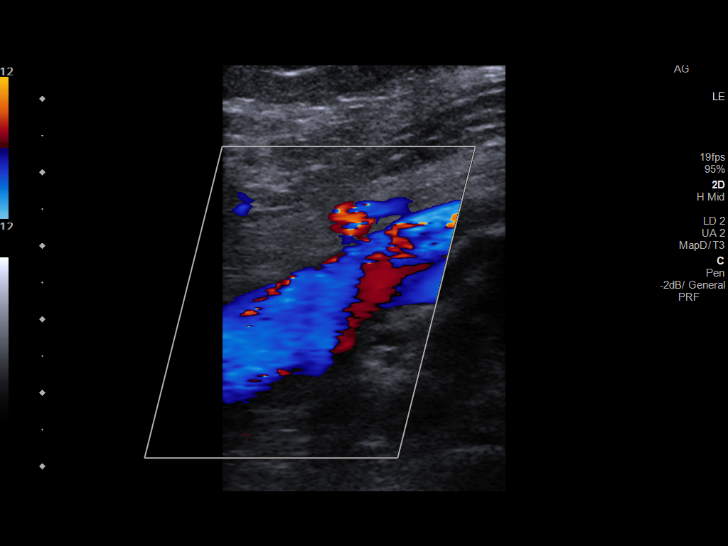
[im 36/44]
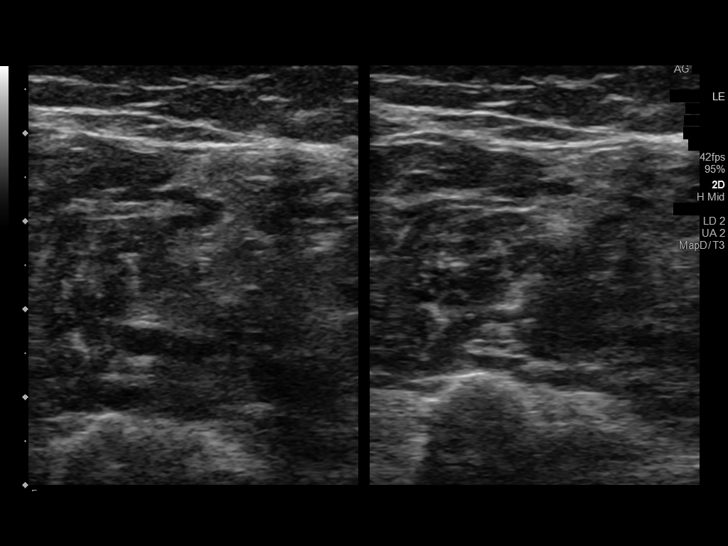
[im 40/44]
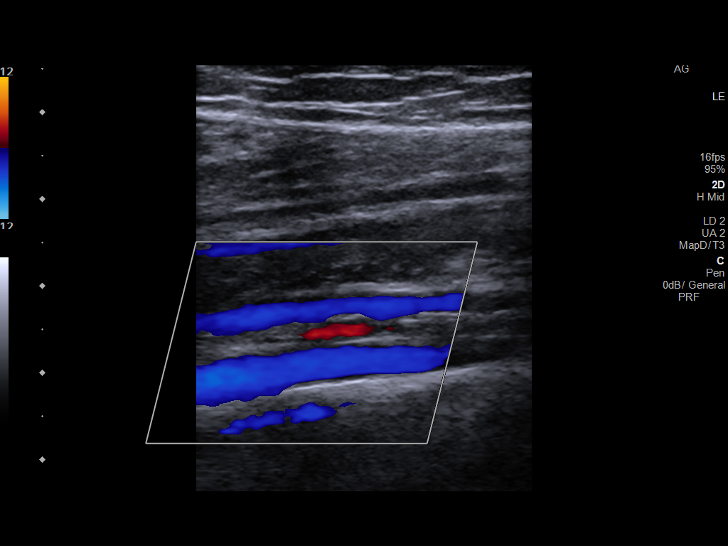
[im 44/44]
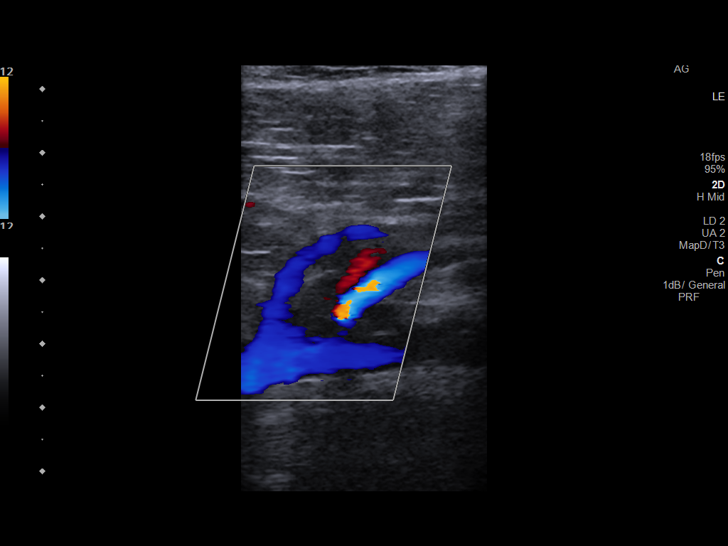

[13 of 24 positions shown; findings below may reference images not displayed]

FINDINGS: Contralateral Common Femoral Vein: Respiratory phasicity is normal
and symmetric with the symptomatic side. No evidence of thrombus.
Normal compressibility.

Common Femoral Vein: No evidence of thrombus. Normal
compressibility, respiratory phasicity and response to augmentation.

Saphenofemoral Junction: No evidence of thrombus. Normal
compressibility and flow on color Doppler imaging.

Profunda Femoral Vein: No evidence of thrombus. Normal
compressibility and flow on color Doppler imaging.

Femoral Vein: No evidence of thrombus. Normal compressibility,
respiratory phasicity and response to augmentation.

Popliteal Vein: No evidence of thrombus. Normal compressibility,
respiratory phasicity and response to augmentation.

Calf Veins: No evidence of thrombus. Normal compressibility and flow
on color Doppler imaging.

Superficial Great Saphenous Vein: No evidence of thrombus. Normal
compressibility.

Venous Reflux:  None.

Other Findings: Mildly prominent superficial inguinal lymph node is
almost certainly reactive in nature.
IMPRESSION: 1. No evidence of deep venous thrombosis.
2. A mildly prominent superficial inguinal lymph node is likely
reactive in nature.
